# Patient Record
Sex: Male | Born: 2005
Health system: Southern US, Community
[De-identification: ages and names within clinical notes are randomized; demographics above are authoritative.]

## PROBLEM LIST (undated history)

## (undated) DIAGNOSIS — Z9109 Other allergy status, other than to drugs and biological substances: Secondary | ICD-10-CM

## (undated) DIAGNOSIS — T7840XA Allergy, unspecified, initial encounter: Secondary | ICD-10-CM

## (undated) HISTORY — DX: Allergy, unspecified, initial encounter: T78.40XA

## (undated) HISTORY — DX: Other allergy status, other than to drugs and biological substances: Z91.09

## (undated) HISTORY — PX: FRACTURE SURGERY: SHX138

---
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
## 2023-01-01 ENCOUNTER — Ambulatory Visit: Payer: 59

## 2023-05-18 ENCOUNTER — Other Ambulatory Visit: Payer: Self-pay

## 2023-05-18 ENCOUNTER — Encounter (HOSPITAL_BASED_OUTPATIENT_CLINIC_OR_DEPARTMENT_OTHER): Payer: Self-pay

## 2023-05-18 ENCOUNTER — Emergency Department (HOSPITAL_BASED_OUTPATIENT_CLINIC_OR_DEPARTMENT_OTHER)
Admission: EM | Admit: 2023-05-18 | Discharge: 2023-05-18 | Disposition: A | Discharge status: Home / Self Care | Payer: BC Managed Care – PPO | Attending: Emergency Medicine | Admitting: Emergency Medicine

## 2023-05-18 DIAGNOSIS — X58XXXA Exposure to other specified factors, initial encounter: Secondary | ICD-10-CM | POA: Insufficient documentation

## 2023-05-18 DIAGNOSIS — S0501XA Injury of conjunctiva and corneal abrasion without foreign body, right eye, initial encounter: Secondary | ICD-10-CM | POA: Diagnosis present

## 2023-05-18 MED ORDER — FLUORESCEIN SODIUM 1 MG OP STRP
1.0000 | ORAL_STRIP | Freq: Once | OPHTHALMIC | Status: AC
  Administered 2023-05-18: 1 via OPHTHALMIC
  Filled 2023-05-18: qty 1

## 2023-05-18 MED ORDER — ERYTHROMYCIN 5 MG/GM OP OINT
TOPICAL_OINTMENT | Freq: Once | OPHTHALMIC | Status: AC
  Filled 2023-05-18: qty 3.5

## 2023-05-18 MED ORDER — TETRACAINE HCL 0.5 % OP SOLN
2.0000 [drp] | Freq: Once | OPHTHALMIC | Status: AC
  Administered 2023-05-18: 2 [drp] via OPHTHALMIC
  Filled 2023-05-18: qty 4

## 2023-05-18 NOTE — ED Triage Notes (Signed)
Pt advises he was power washing dirty ventilation fans approx 1030, "something flew in my eye, I washed it out w saline & put drops in but it still hurts." Advises "some" blurred vision in R eye." R eye appears irritated, red.

## 2023-05-18 NOTE — ED Notes (Signed)
 RN reviewed discharge instructions with pt. Pt verbalized understanding and had no further questions. VSS upon discharge.  

## 2023-05-18 NOTE — Discharge Instructions (Signed)
Please read and follow all provided instructions.  Your diagnoses today include:  1. Abrasion of right cornea, initial encounter    Tests performed today include: Visual acuity testing to check your vision Fluorescein dye examination to look for scratches on your eye Vital signs. See below for your results today.   Medications prescribed:  Erythromycin  - antibiotic eye ointment  Use this medication as follows: Apply 1/4" of the antibiotic ointment to affected eye up to 6 times a day while awake for 7 days  Please use over-the-counter NSAID medications (ibuprofen, naproxen) or Tylenol (acetaminophen) as directed on the packaging for pain -- as long as you do not have any reasons avoid these medications. Reasons to avoid NSAID medications include: weak kidneys, a history of bleeding in your stomach or gut, or uncontrolled high blood pressure or previous heart attack. Reasons to avoid Tylenol include: liver problems or ongoing alcohol use. Never take more than 4000mg  or 8 Extra strength Tylenol in a 24 hour period.     Take any prescribed medications only as directed.  Home care instructions:  Follow any educational materials contained in this packet.  You have a scratch of the eye on the cornea (the clear part of the eye). This condition may be caused by trauma. It is a common problem for people who wear contact lenses. Proper treatment is important. No evidence of infection is noted today, but you could develop an infection called a corneal ulcer or have some retained foreign body that may or may not have been noted today in the Emergency Department. Ulcers are not only painful, but they may also scar the cornea and cause permanent damage to the eye.   If you wear contact lenses, do not use them until your eye caregiver approves. Follow-up care is necessary to be sure the corneal abrasion is healing if not completely resolved in 2-3 days. See your caregiver or eye specialist as suggested for  followup.   Follow-up instructions: Please follow-up with the opthalmologist listed in the next 2-3 days for further evaluation of your symptoms if not improving.   Return instructions:  Please return to the Emergency Department if you experience worsening symptoms.  Please return immediately if you develop severe pain, pus drainage, new change in vision, or fever. Please return if you have any other emergent concerns.  Additional Information:  Your vital signs today were: BP 129/69   Pulse 78   Temp 98 F (36.7 C)   Resp 16   SpO2 100%  If your blood pressure (BP) was elevated above 135/85 this visit, please have this repeated by your doctor within one month.

## 2023-05-18 NOTE — ED Provider Notes (Addendum)
Travis Contreras EMERGENCY DEPARTMENT AT [facility] Provider Note   CSN: [id number] Arrival date & time: 05/18/23  1638     History  Chief Complaint  Patient presents with   Eye Pain    R    Travis Contreras is a 17 y.o. male.  Patient presents to the emergency department for evaluation of right eye pain.  Patient was power washing ventilation fans this morning around 1030.  He felt something go into his right eye.  He did do eye irrigation but as the day went on, his eye became more more irritated.  No vision loss or change in vision.  No history of contact lens use or eye surgeries.  He tried to run errands this afternoon but was having difficulty driving, prompting emergency department visit.  He has taken ibuprofen.       Home Medications Prior to Admission medications   Medication Sig Start Date End Date Taking? Authorizing Provider  albuterol (VENTOLIN HFA) 108 (90 Base) MCG/ACT inhaler Inhale into the lungs every 6 (six) hours as needed for wheezing or shortness of breath. 10/12/22   Alveria Apley, NP  azelastine (ASTELIN) 0.1 % nasal spray Place 1-2 sprays into both nostrils 2 (two) times daily as needed (nasal drainage). Use in each nostril as directed 09/25/22   Ellamae Sia, DO  fluticasone [facility]) 50 MCG/ACT nasal spray Place 1 spray into both nostrils 2 (two) times daily as needed (nasal congestion). 09/25/22   Ellamae Sia, DO  Olopatadine HCl 0.2 % SOLN Apply 1 drop to eye daily as needed (itchy/watery eyes). 09/25/22   Ellamae Sia, DO      Allergies    Patient has no known allergies.    Review of Systems   Review of Systems  Physical Exam Updated Vital Signs BP 129/69   Pulse 78   Temp 98 F (36.7 C)   Resp 16   SpO2 100%  Physical Exam Vitals and nursing note reviewed.  Constitutional:      Appearance: He is well-developed.  HENT:     Head: Normocephalic and atraumatic.  Eyes:     General: Lids are normal.     Conjunctiva/sclera:     Right  eye: Right conjunctiva is injected. No chemosis, exudate or hemorrhage.    Left eye: Left conjunctiva is not injected. No chemosis, exudate or hemorrhage.    Pupils: Pupils are equal, round, and reactive to light.     Right eye: Pupil is round, reactive and not sluggish. Corneal abrasion and fluorescein uptake present. Seidel exam negative.     Left eye: Pupil is round, reactive and not sluggish.      Comments: Patient with corneal abrasion noted from approximately the 1130 to 12:30 position on the cornea.  Negative Seidel.  Patient reports great improvement in pain shortly after administration of tetracaine drops.  Gross vision is intact  Pulmonary:     Effort: No respiratory distress.  Musculoskeletal:     Cervical back: Normal range of motion and neck supple.  Skin:    General: Skin is warm and dry.  Neurological:     Mental Status: He is alert.     ED Results / Procedures / Treatments   Labs (all labs ordered are listed, but only abnormal results are displayed) Labs Reviewed - No data to display  EKG None  Radiology No results found.  Procedures Procedures    Medications Ordered in ED Medications  tetracaine (PONTOCAINE) 0.5 %  ophthalmic solution 2 drop (has no administration in time range)  fluorescein ophthalmic strip 1 strip (has no administration in time range)  erythromycin ophthalmic ointment (has no administration in time range)    ED Course/ Medical Decision Making/ A&P    Patient seen and examined. History obtained directly from patient. Work-up including labs, imaging, EKG ordered in triage, if performed, were reviewed.    Labs/EKG: None ordered  Imaging: None ordered  Medications/Fluids: Ordered: Tetracaine, fluorescein  Most recent vital signs reviewed and are as follows: BP 129/69   Pulse 78   Temp 98 F (36.7 C)   Resp 16   SpO2 100%   Initial impression: Corneal abrasion  Home treatment plan: OTC NSAIDs, avoidance of bright lights,  erythromycin ointment  Return instructions discussed with patient: Development of worsening pain, redness, drainage from the eye, vision loss  Follow-up instructions discussed with patient: Follow-up with ophthalmology referral in 3 days if pain is not improving.                                Medical Decision Making Risk Prescription drug management.   Patient with corneal abrasion.  Do not suspect open globe.  Pupil is normal and reactive to light.  Do not suspect other orbital trauma.  No signs of foreign body.  No concern for orbital fracture.  Routine care indicated as discussed above with outpatient follow-up as needed.    Final Clinical Impression(s) / ED Diagnoses Final diagnoses:  Abrasion of right cornea, initial encounter    Rx / DC Orders ED Discharge Orders     None         Renne Crigler, PA-C 05/18/23 1745    Rolan Bucco, MD 05/18/23 [phone]

## 2023-05-18 NOTE — ED Notes (Signed)
ED Provider at bedside. 

## 2023-08-12 NOTE — Progress Notes (Deleted)
 Follow Up Note  RE: LAURI TILL MRN: [id number] DOB: 08/29/2005 Date of Office Visit: 08/13/2023  Referring provider: Alveria Apley, NP Primary care provider: Alveria Apley, NP  Chief Complaint: No chief complaint on file.  History of Present Illness: I had the pleasure of seeing Travis Contreras for a follow up visit at the [facility] on 08/12/2023. He is a 18 y.o. male, who is being followed for allergic rhinoconjunctivitis. His previous allergy office visit was on 09/25/2022 with Dr. Selena Batten. Today is a regular follow up visit.  Discussed the use of AI scribe software for clinical note transcription with the patient, who gave verbal consent to proceed.  History of Present Illness            ***  Assessment and Plan: Travis Contreras is a 18 y.o. male with: Seasonal and perennial allergic rhinoconjunctivitis Past history - 2021 skin testing is positive to grasses, weeds, trees, molds, dust mites, cat, dog and cockroach. 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 Interim history - symptoms flared this spring and on prednisone currently with good benefit.  See below for environmental control measures. Use over the counter antihistamines such as Zyrtec (cetirizine), Claritin (loratadine), Allegra (fexofenadine), or Xyzal (levocetirizine) daily as needed. May take twice a day during allergy flares. May switch antihistamines every few months. Use azelastine nasal spray 1-2 sprays per nostril twice a day as needed for runny nose/drainage. Nasal saline spray (i.e., Simply Saline) or nasal saline lavage (i.e., NeilMed) is recommended as needed and prior to medicated nasal sprays. Use olopatadine eye drops 0.2% once a day as needed for itchy/watery eyes. If your allergies are not controlled will send in montelukast (Singulair) next. Consider allergy injections for long term control if above medications do not help the symptoms - handout given.  Discussed rush vs normal build  up.  Assessment and Plan              No follow-ups on file.  No orders of the defined types were placed in this encounter.  Lab Orders  No laboratory test(s) ordered today    Diagnostics: Spirometry:  Tracings reviewed. His effort: {Blank single:19197::"Good reproducible efforts.","It was hard to get consistent efforts and there is a question as to whether this reflects a maximal maneuver.","Poor effort, data can not be interpreted."} FVC: ***L FEV1: ***L, ***% predicted FEV1/FVC ratio: ***% Interpretation: {Blank single:19197::"Spirometry consistent with mild obstructive disease","Spirometry consistent with moderate obstructive disease","Spirometry consistent with severe obstructive disease","Spirometry consistent with possible restrictive disease","Spirometry consistent with mixed obstructive and restrictive disease","Spirometry uninterpretable due to technique","Spirometry consistent with normal pattern","No overt abnormalities noted given today's efforts"}.  Please see scanned spirometry results for details.  Skin Testing: {Blank single:19197::"Select foods","Environmental allergy panel","Environmental allergy panel and select foods","Food allergy panel","None","Deferred due to recent antihistamines use"}. *** Results discussed with patient/family.   Medication List:  Current Outpatient Medications  Medication Sig Dispense Refill  . albuterol (VENTOLIN HFA) 108 (90 Base) MCG/ACT inhaler Inhale into the lungs every 6 (six) hours as needed for wheezing or shortness of breath. 18 g 1  . azelastine (ASTELIN) 0.1 % nasal spray Place 1-2 sprays into both nostrils 2 (two) times daily as needed (nasal drainage). Use in each nostril as directed 30 mL 5  . fluticasone (FLONASE) 50 MCG/ACT nasal spray Place 1 spray into both nostrils 2 (two) times daily as needed (nasal congestion). 16 g 5  . Olopatadine HCl 0.2 % SOLN Apply 1 drop to eye  daily as needed (itchy/watery eyes). 2.5 mL 5    No current facility-administered medications for this visit.   Allergies: No Known Allergies I reviewed his past medical history, social history, family history, and environmental history and no significant changes have been reported from his previous visit.  Review of Systems  Constitutional:  Negative for appetite change, chills, fever and unexpected weight change.  HENT:  Negative for congestion and rhinorrhea.   Eyes:  Negative for itching.  Respiratory:  Negative for cough, chest tightness, shortness of breath and wheezing.   Cardiovascular:  Negative for chest pain.  Gastrointestinal:  Negative for abdominal pain.  Genitourinary:  Negative for difficulty urinating.  Skin:  Negative for rash.  Neurological:  Negative for headaches.   Objective: There were no vitals taken for this visit. There is no height or weight on file to calculate BMI. Physical Exam Vitals and nursing note reviewed.  Constitutional:      Appearance: Normal appearance. He is well-developed.  HENT:     Head: Normocephalic and atraumatic.     Right Ear: Tympanic membrane and external ear normal.     Left Ear: Tympanic membrane and external ear normal.     Nose: Nose normal.     Mouth/Throat:     Mouth: Mucous membranes are moist.     Pharynx: Oropharynx is clear.  Eyes:     Conjunctiva/sclera: Conjunctivae normal.  Cardiovascular:     Rate and Rhythm: Normal rate and regular rhythm.     Heart sounds: Normal heart sounds. No murmur heard.    No friction rub. No gallop.  Pulmonary:     Effort: Pulmonary effort is normal.     Breath sounds: Normal breath sounds. No wheezing, rhonchi or rales.  Musculoskeletal:     Cervical back: Neck supple.  Skin:    General: Skin is warm.     Findings: No rash.  Neurological:     Mental Status: He is alert and oriented to person, place, and time.  Psychiatric:        Behavior: Behavior normal.  Previous notes and tests were reviewed. The plan was reviewed  with the patient/family, and all questions/concerned were addressed.  It was my pleasure to see Travis Contreras today and participate in his care. Please feel free to contact me with any questions or concerns.  Sincerely,  Wyline Mood, DO Allergy & Immunology  [facility] office: [phone] [facility] office: [phone]

## 2023-08-13 ENCOUNTER — Ambulatory Visit: Payer: Self-pay | Admitting: Allergy

## 2023-08-13 DIAGNOSIS — H1010 Acute atopic conjunctivitis, unspecified eye: Secondary | ICD-10-CM

## 2023-10-03 ENCOUNTER — Encounter: Payer: 59 | Admitting: Family Medicine

## 2024-02-06 ENCOUNTER — Ambulatory Visit: Payer: Self-pay

## 2024-02-06 ENCOUNTER — Encounter: Payer: Self-pay | Admitting: Family Medicine

## 2024-02-06 ENCOUNTER — Ambulatory Visit (INDEPENDENT_AMBULATORY_CARE_PROVIDER_SITE_OTHER): Admitting: Family Medicine

## 2024-02-06 VITALS — BP 136/84 | HR 93 | Temp 97.9°F | Wt 159.8 lb

## 2024-02-06 DIAGNOSIS — H9312 Tinnitus, left ear: Secondary | ICD-10-CM

## 2024-02-06 DIAGNOSIS — H6123 Impacted cerumen, bilateral: Secondary | ICD-10-CM

## 2024-02-06 NOTE — Progress Notes (Signed)
 Established Patient Office Visit  Subjective   Patient ID: Travis Contreras, male    DOB: [birth year]  Age: 18 y.o. MRN: [id number]  Chief Complaint  Patient presents with   Tinnitus    HPI   Travis Contreras is seen as a work in with 2-day history of some ringing left ear and feeling somewhat off balance.  He had some nasal congestion.  No ear drainage.  No pulsatile tinnitus.  Somewhat decreased acuity hearing left ear.  No headache.  No focal weakness.  Has had cerumen impactions in the past.  Denies any recent fever or chills.  No cough.  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  Past Medical History:  Diagnosis Date   Allergy  12/27/2013   Environmental allergies    Past Surgical History:  Procedure Laterality Date   ACHILLES TENDON SURGERY Right 10/25/2018   Procedure: Achilles Tendon Repair;  Surgeon: Elsa Lonni SAUNDERS, MD;  Location: [facility];  Service: Orthopedics;  Laterality: Right;   FRACTURE SURGERY Right    [employer] 03/24/2020 Rt Foot   WOUND EXPLORATION Right 10/25/2018   Procedure: Irrigation and Debridement right ankle wound.;  Surgeon: Elsa Lonni SAUNDERS, MD;  Location: [facility] OR;  Service: Orthopedics;  Laterality: Right;    reports that he has never smoked. He has never used smokeless tobacco. He reports that he does not drink alcohol and does not use drugs. family history includes Allergies in his father; Hypertension in his father. No Known Allergies  Review of Systems  Constitutional:  Negative for chills and fever.  HENT:  Positive for congestion, hearing loss and tinnitus. Negative for ear discharge, ear pain and sore throat.   Respiratory:  Negative for cough and shortness of breath.   Cardiovascular:  Negative for chest pain.      Objective:     BP 136/84   Pulse 93   Temp 97.9 F (36.6 C) (Oral)   Wt 159 lb 12.8 oz (72.5 kg)   SpO2 97%   BMI 23.60 kg/m  BP Readings from Last 3 Encounters:  02/06/24 136/84  05/18/23 129/69  10/02/22  120/74   Wt Readings from Last 3 Encounters:  02/06/24 159 lb 12.8 oz (72.5 kg) (57%, Z= 0.17)*  10/02/22 150 lb 8 oz (68.3 kg) (50%, Z= 0.00)*  09/28/22 147 lb 6.4 oz (66.9 kg) (45%, Z= -0.13)*   * Growth percentiles are based on CDC (Boys, 2-20 Years) data.      Physical Exam Vitals reviewed.  Constitutional:      General: He is not in acute distress.    Appearance: He is not ill-appearing.  HENT:     Ears:     Comments: Cerumen impactions bilaterally. Cardiovascular:     Rate and Rhythm: Normal rate and regular rhythm.  Neurological:     Mental Status: He is alert.      No results found for any visits on 02/06/24.    The ASCVD Risk score (Arnett DK, et al., 2019) failed to calculate for the following reasons:   The 2019 ASCVD risk score is only valid for ages 96 to 31    Assessment & Plan:   2-day history of tinnitus left ear along with subjective decreased hearing.  Does have cerumen impactions.  Discussed irrigation.  Patient aware of risk including bleeding, pain, low risk of perforation.  Both ear canals were irrigated without difficulty.  Cerumen fully removed.  Patient noticed subjective improvement in hearing afterwards.  Be  in touch of tinnitus not fully cleared in 1 to 2 weeks  Wolm Scarlet, MD

## 2024-02-06 NOTE — Telephone Encounter (Signed)
 FYI Only or Action Required?: Action required by provider: request for appointment.  Patient was last seen in primary care on 10/02/2022 by Travis Philippe SAUNDERS, NP.  Called Nurse Triage reporting Otalgia.  Symptoms began several days ago.  Interventions attempted: Nothing.  Symptoms are: gradually worsening.  Triage Disposition: See Physician Within 24 Hours  Patient/caregiver understands and will follow disposition?: Yes     Copied from CRM [id number]. Topic: Clinical - Red Word Triage >> Feb 06, 2024  9:49 AM Rea ORN wrote: Red Word that prompted transfer to Nurse Triage: Left eye puffy, left ear ache. Reason for Disposition  Earache  (Exceptions: Brief ear pain of lasting less than 60 minutes, or earache occurring during air travel.)  Answer Assessment - Initial Assessment Questions 1. LOCATION: Which ear is involved?     Left ear; denies swollen, redness, drainage. 2.5 days feels like it's inside my ear, can't hear out of it, it's ringing really bad. Denies head injury, trauma,only loud noises from work. 2. ONSET: When did the ear pain start?      2.5 days ago, it's getting worse 3. SEVERITY: How bad is the pain?  (Scale 1-10; mild, moderate or severe)     7/10 4. URI SYMPTOMS: Do you have a runny nose or cough?     Runny nose; green 5. FEVER: Do you have a fever? If Yes, ask: What is your temperature, how was it measured, and when did it start?     Denies fever, chills 6. CAUSE: Have you been swimming recently?, How often do you use Q-TIPS?, Have you had any recent air travel or scuba diving? none      7. OTHER SYMPTOMS: Do you have any other symptoms? (e.g., decreased hearing, dizziness, headache, stiff neck, vomiting)    Reports headache; ringing in ear goes away and comes back  Protocols used: Earache-A-AH  Reason for Disposition  Earache  (Exceptions: Brief ear pain of lasting less than 60 minutes, or earache occurring during air  travel.)  Answer Assessment - Initial Assessment Questions Scheduled appt 02/06/24. Advised UC/ED if symptoms worsen.   1. LOCATION: Which ear is involved?     Left ear; denies swollen, redness, drainage. 2.5 days feels like it's inside my ear, can't hear out of it, it's ringing really bad. Denies head injury, trauma,only loud noises from work. 2. ONSET: When did the ear pain start?      2.5 days ago, it's getting worse 3. SEVERITY: How bad is the pain?  (Scale 1-10; mild, moderate or severe)     7/10 4. URI SYMPTOMS: Do you have a runny nose or cough?     Runny nose; green 5. FEVER: Do you have a fever? If Yes, ask: What is your temperature, how was it measured, and when did it start?     Denies fever, chills 6. CAUSE: Have you been swimming recently?, How often do you use Q-TIPS?, Have you had any recent air travel or scuba diving? none      7. OTHER SYMPTOMS: Do you have any other symptoms? (e.g., decreased hearing, dizziness, headache, stiff neck, vomiting)    Reports headache; ringing in ear goes away and comes back  Protocols used: Earache-A-AH

## 2024-05-14 ENCOUNTER — Ambulatory Visit: Payer: Self-pay

## 2024-05-14 NOTE — Telephone Encounter (Signed)
 FYI Only or Action Required?: FYI only for provider: ED advised.  Patient was last seen in primary care on 02/06/2024 by Micheal Wolm ORN, MD.  Called Nurse Triage reporting Influenza.  Symptoms began several days ago.  Interventions attempted: OTC medications: Tylenol , motrin .  Symptoms are: gradually worsening.  Triage Disposition: Go to ED Now (Notify PCP)  Patient/caregiver understands and will follow disposition?: Yes  Went to UC yesterday, tested for flu and strep. Step negative, flu positive. Reporting central chest tightness and sore CP only when coughing, can't sleep for more than 30 minutes, sweating. Moderate SOB at rest, worse in the morning. Getting worse, concerned its developing into PNA. Coughing up green mucus. Severe cough, hoarse voice. Does not have a thermometer, has been have sweating and chills since Monday, worse today. Denies hx of lung or cardiac issues. Advised ED, agreeable to go and has someone that can take him. Advised 911 for worsening symptoms.   Copied from CRM [id number]. Topic: Clinical - Red Word Triage >> May 14, 2024 10:30 AM Travis Contreras wrote: Red Word that prompted transfer to Nurse Triage: Flu,tightness in chest,not able to eat,sweating,not ale to sleep Reason for Disposition  MODERATE difficulty breathing (e.g., speaks in phrases, SOB even at rest, pulse 100-120)  Answer Assessment - Initial Assessment Questions 1. DIAGNOSIS CONFIRMATION: When was the influenza diagnosed? By whom? Did you get a test for it?     At [facility] yesterday 2. INFLUENZA MEDICINES: Were you prescribed any medicines for the influenza?  (e.g., zanamivir [Relenza], oseltamivir  [Tamiflu ]).      Denies 3. SYMPTOMS: What is your main symptom or concern? (e.g., cough, fever, shortness of breath, muscle aches)     Sweats, moderate SOB, worse in the morning, severe cough, central CP when coughing and chest tightness, insomnia 4. ONSET: When did the symptoms start?      3  days ago 5. COUGH: Do you have a cough? If Yes, ask: How bad is the cough?       Severe 6. FEVER: Do you have a fever? If Yes, ask: What is your temperature, how was it measured, and when did it start?     100.3 F in UC yesterday, unsure what it is today. Does not have thermometer. Feels it's worse d/t increased sweating. 7. BREATHING DIFFICULTY: Are you having any difficulty breathing? (e.g., normal; shortness of breath, wheezing, unable to speak)      Moderate SOB at rest 9. HIGH RISK FOR COMPLICATIONS: Do you have any chronic medical problems? (e.g., asthma, heart or lung disease, obesity, weak immune system)     Denies  Protocols used: Influenza (Flu) Follow-up Call-A-AH

## 2024-05-15 ENCOUNTER — Emergency Department (HOSPITAL_BASED_OUTPATIENT_CLINIC_OR_DEPARTMENT_OTHER)
Admission: EM | Admit: 2024-05-15 | Discharge: 2024-05-15 | Disposition: A | Discharge status: Home / Self Care | Source: Home / Self Care | Attending: Emergency Medicine | Admitting: Emergency Medicine

## 2024-05-15 ENCOUNTER — Other Ambulatory Visit: Payer: Self-pay

## 2024-05-15 ENCOUNTER — Emergency Department (HOSPITAL_BASED_OUTPATIENT_CLINIC_OR_DEPARTMENT_OTHER): Admitting: Radiology

## 2024-05-15 DIAGNOSIS — J101 Influenza due to other identified influenza virus with other respiratory manifestations: Secondary | ICD-10-CM | POA: Diagnosis not present

## 2024-05-15 DIAGNOSIS — R040 Epistaxis: Secondary | ICD-10-CM | POA: Insufficient documentation

## 2024-05-15 DIAGNOSIS — R509 Fever, unspecified: Secondary | ICD-10-CM | POA: Diagnosis present

## 2024-05-15 LAB — CBC WITH DIFFERENTIAL/PLATELET
Abs Immature Granulocytes: 0.02 K/uL (ref 0.00–0.07)
Basophils Absolute: 0 K/uL (ref 0.0–0.1)
Basophils Relative: 0 %
Eosinophils Absolute: 0 K/uL (ref 0.0–0.5)
Eosinophils Relative: 0 %
HCT: 41.1 % (ref 39.0–52.0)
Hemoglobin: 14.5 g/dL (ref 13.0–17.0)
Immature Granulocytes: 0 %
Lymphocytes Relative: 15 %
Lymphs Abs: 0.9 K/uL (ref 0.7–4.0)
MCH: 30.1 pg (ref 26.0–34.0)
MCHC: 35.3 g/dL (ref 30.0–36.0)
MCV: 85.3 fL (ref 80.0–100.0)
Monocytes Absolute: 0.8 K/uL (ref 0.1–1.0)
Monocytes Relative: 14 %
Neutro Abs: 4.3 K/uL (ref 1.7–7.7)
Neutrophils Relative %: 71 %
Platelets: 173 K/uL (ref 150–400)
RBC: 4.82 MIL/uL (ref 4.22–5.81)
RDW: 12.7 % (ref 11.5–15.5)
WBC: 6.1 K/uL (ref 4.0–10.5)
nRBC: 0 % (ref 0.0–0.2)

## 2024-05-15 LAB — BASIC METABOLIC PANEL WITH GFR
Anion gap: 13 (ref 5–15)
BUN: 10 mg/dL (ref 6–20)
CO2: 27 mmol/L (ref 22–32)
Calcium: 9.6 mg/dL (ref 8.9–10.3)
Chloride: 94 mmol/L — ABNORMAL LOW (ref 98–111)
Creatinine, Ser: 0.92 mg/dL (ref 0.61–1.24)
GFR, Estimated: >60 mL/min (ref >60)
Glucose, Bld: 98 mg/dL (ref 70–99)
Potassium: 3.5 mmol/L (ref 3.5–5.1)
Sodium: 134 mmol/L — ABNORMAL LOW (ref 135–145)

## 2024-05-15 MED ORDER — ACETAMINOPHEN 325 MG PO TABS
650.0000 mg | ORAL_TABLET | Freq: Once | ORAL | Status: AC | PRN
  Administered 2024-05-15: 09:00:00 650 mg via ORAL
  Filled 2024-05-15: qty 2

## 2024-05-15 MED ORDER — PROMETHAZINE-DM 6.25-15 MG/5ML PO SYRP: 5.0000 mL | ORAL_SOLUTION | Freq: Four times a day (QID) | ORAL | 0 refills | Status: AC | PRN

## 2024-05-15 MED ORDER — LORATADINE 10 MG PO TABS: 10.0000 mg | ORAL_TABLET | Freq: Every day | ORAL | 0 refills | Status: AC | PRN

## 2024-05-15 MED ADMIN — Sodium Chloride IV Soln 0.9%: 1000 mL | INTRAVENOUS | @ 10:00:00 | NDC 00264580200

## 2024-05-15 MED ADMIN — Ondansetron HCl Inj 4 MG/2ML (2 MG/ML): 4 mg | INTRAVENOUS | @ 10:00:00 | NDC 00409475518

## 2024-05-15 MED FILL — Ondansetron HCl Inj 4 MG/2ML (2 MG/ML): 4.0000 mg | INTRAMUSCULAR | Qty: 2 | Status: AC

## 2024-05-15 NOTE — Discharge Instructions (Addendum)
 Please read and follow all provided instructions.  Your diagnoses today include:  1. Influenza A   2. Epistaxis     Tests performed today include: Complete blood cell count: Was normal Basic metabolic panel: Slightly low sodium and chloride, otherwise no issues Chest x-ray: Was negative for pneumonia Vital signs. See below for your results today.   Medications prescribed:  Promethazine  cough syrup  This medication can make you drowsy so please do not work or drive if taking it.  Loratadine   Take any prescribed medications only as directed.  Home care instructions:  Follow any educational materials contained in this packet. Please continue drinking plenty of fluids. Use over-the-counter cold and flu medications as needed as directed on packaging for symptom relief. You may also use ibuprofen or tylenol  as directed on packaging for pain or fever.   BE VERY CAREFUL not to take multiple medicines containing Tylenol  (also called acetaminophen ). Doing so can lead to an overdose which can damage your liver and cause liver failure and possibly death.   Follow-up instructions: Please follow-up with your primary care provider in the next 5 days for further evaluation of your symptoms.   Return instructions:  Please return to the Emergency Department if you experience worsening symptoms. Please return if you have a high fever greater than 101 degrees not controlled with over-the-counter medications, persistent vomiting and cannot keep down fluids, or worsening trouble breathing. Please return if you have any other emergent concerns.  Additional Information:  Your vital signs today were: BP 131/71 (BP Location: Left Arm)   Pulse 87   Temp 99.6 F (37.6 C) (Oral)   Resp 20   SpO2 99%  If your blood pressure (BP) was elevated above 135/85 this visit, please have this repeated by your doctor within one month.

## 2024-05-15 NOTE — ED Provider Notes (Signed)
 [name] Provider Note   CSN: [id number] Arrival date & time: 05/15/24  0900     Patient presents with: Emesis   Travis Contreras is a 18 y.o. male.   Patient with no significant past medical history presents to the emergency department for evaluation of ongoing flu symptoms.  Patient started having symptoms on 12/14.  He went to urgent care on 12/16, tested positive for influenza A.  Patient has had fever, sore throat, cough, body aches.  He reports getting very little sleep due to constantly waking with coughing.  He states that he feels 100 times worse.  He has been taking over-the-counter medication such as TheraFlu, and over-the-counter cough medication.  He started having nosebleeds yesterday, awoke covered in blood this morning.  No lightheadedness or syncope.  Chest pain with coughing, but not persistently.  Last vomited this morning.  No diarrhea or urinary symptoms.       Prior to Admission medications  Medication Sig Start Date End Date Taking? Authorizing Provider  albuterol  (VENTOLIN  HFA) 108 (90 Base) MCG/ACT inhaler Inhale into the lungs every 6 (six) hours as needed for wheezing or shortness of breath. 10/12/22   Williamson, Joanna R, NP  azelastine  (ASTELIN ) 0.1 % nasal spray Place 1-2 sprays into both nostrils 2 (two) times daily as needed (nasal drainage). Use in each nostril as directed 09/25/22   Luke Orlan HERO, DO  fluticasone  (FLONASE ) 50 MCG/ACT nasal spray Place 1 spray into both nostrils 2 (two) times daily as needed (nasal congestion). 09/25/22   Luke Orlan HERO, DO  Olopatadine  HCl 0.2 % SOLN Apply 1 drop to eye daily as needed (itchy/watery eyes). 09/25/22   Luke Orlan HERO, DO    Allergies: Patient has no known allergies.    Review of Systems  Updated Vital Signs BP 138/72   Pulse (!) 110   Temp (!) 100.9 F (38.3 C) (Oral)   Resp 20   SpO2 100%   Physical Exam Vitals and nursing note reviewed.  Constitutional:       Appearance: He is well-developed.  HENT:     Head: Normocephalic and atraumatic.     Jaw: No trismus.     Right Ear: Tympanic membrane, ear canal and external ear normal.     Left Ear: Tympanic membrane, ear canal and external ear normal.     Nose: No mucosal edema or rhinorrhea.     Right Nostril: Epistaxis present.     Left Nostril: Epistaxis present.     Mouth/Throat:     Mouth: Mucous membranes are not dry.     Pharynx: Uvula midline. Posterior oropharyngeal erythema present. No oropharyngeal exudate or uvula swelling.     Tonsils: No tonsillar abscesses.  Eyes:     General:        Right eye: No discharge.        Left eye: No discharge.     Conjunctiva/sclera: Conjunctivae normal.  Cardiovascular:     Rate and Rhythm: Normal rate and regular rhythm.     Heart sounds: Normal heart sounds.  Pulmonary:     Effort: Pulmonary effort is normal. No respiratory distress.     Breath sounds: Normal breath sounds. No wheezing or rales.     Comments: Lungs clear to auscultation bilaterally. Abdominal:     Palpations: Abdomen is soft.     Tenderness: There is no abdominal tenderness.  Musculoskeletal:     Cervical back: Normal range of motion and  neck supple.  Skin:    General: Skin is warm and dry.  Neurological:     Mental Status: He is alert.     (all labs ordered are listed, but only abnormal results are displayed) Labs Reviewed  BASIC METABOLIC PANEL WITH GFR - Abnormal; Notable for the following components:      Result Value   Sodium 134 (*)    Chloride 94 (*)    All other components within normal limits  CBC WITH DIFFERENTIAL/PLATELET    EKG: None  Radiology: DG Chest 2 View Result Date: 05/15/2024 CLINICAL DATA:  Cough and chest pain. EXAM: CHEST - 2 VIEW COMPARISON:  09/16/2021 FINDINGS: Lungs are adequately inflated and otherwise clear. Cardiomediastinal silhouette and remainder of the exam is unchanged. IMPRESSION: No active cardiopulmonary disease.  Electronically Signed   By: Toribio Agreste M.D.   On: 05/15/2024 09:46     Procedures   Medications Ordered in the ED  sodium chloride  0.9 % bolus 1,000 mL (has no administration in time range)  ondansetron  (ZOFRAN ) injection 4 mg (has no administration in time range)  acetaminophen  (TYLENOL ) tablet 650 mg (650 mg Oral Given 05/15/24 9072)   ED Course  Patient seen and examined. History obtained directly from patient.   Labs/EKG: Ordered CBC, BMP due to vomiting.  Imaging: Chest x-ray ordered in triage, personally reviewed and interpreted, agree negative without signs of pneumonia.  Medications/Fluids: Ordered: IV Zofran , IV fluid bolus.   Most recent vital signs reviewed and are as follows: BP 138/72   Pulse (!) 110   Temp (!) 100.9 F (38.3 C) (Oral)   Resp 20   SpO2 100%   Initial impression: Flu A, ongoing symptoms with vomiting, bothersome cough, active nosebleed.  Patient provided with clip for the nose.  11:14 AM Reassessment performed. Patient appears stable, comfortable.  IV fluids completed.  Nosebleed controlled.  Labs personally reviewed and interpreted including: CBC unremarkable; BMP with sodium 134, chloride 94 otherwise unremarkable.  Reviewed pertinent lab work and imaging with patient at bedside. Questions answered.   Most current vital signs reviewed and are as follows: BP 131/71 (BP Location: Left Arm)   Pulse 87   Temp 99.6 F (37.6 C) (Oral)   Resp 20   SpO2 99%   Plan: Discharge to home.   Prescriptions written for: Promethazine  cough syrup, Claritin   Other home care instructions discussed: OTC meds as needed, rest, hydration  Patient discharged to home. Encouraged to rest and drink plenty of fluids.  Patient told to return to ED or see their primary doctor if their symptoms worsen, high fever not controlled with tylenol , persistent vomiting, they feel they are dehydrated, or if they have any other concerns.  Patient verbalized understanding  and agreed with plan.                                     Medical Decision Making Amount and/or Complexity of Data Reviewed Labs: ordered. Radiology: ordered.  Risk OTC drugs. Prescription drug management.   Patient presents with flulike symptoms.  Recent testing positive for influenza.  Treated here with IV fluids.  Lab workup is reassuring.  Clinically improved.  Nosebleed on arrival, controlled with pressure.  Patient is well-appearing.  Vital signs are stable, fevers controlled.  No clinical signs and symptoms of significant dehydration and is currently tolerating fluids.  Considered secondary infection such as pneumonia, however lungs are clear  on exam and patient without hypoxia.  Low concern for sepsis.  In addition, low clinical concern for mononucleosis.   Given well appearance, supportive therapy indicated currently.  Discussed signs and symptoms to return with patient at bedside.  Patient seems reliable to return as needed.       Final diagnoses:  Influenza A  Epistaxis    ED Discharge Orders          Ordered    promethazine -dextromethorphan (PROMETHAZINE -DM) 6.25-15 MG/5ML syrup  4 times daily PRN        05/15/24 1112    loratadine  (CLARITIN ) 10 MG tablet  Daily PRN        05/15/24 1112               Desiderio Chew, PA-C 05/15/24 1116

## 2024-05-15 NOTE — ED Notes (Signed)
 ED Provider at bedside.

## 2024-05-15 NOTE — ED Triage Notes (Signed)
 Pt caox4 ambulatory reporting he went to [facility] Monday was flu+, s/s since Sunday. Pt states productive cough, chills, and vomiting have worsened minimal relief with OTC meds.

## 2024-05-15 NOTE — ED Notes (Signed)
 Pt to and from xray with no change in condition
